# Patient Record
Sex: Female | Born: 1990 | Race: White | Hispanic: No | Smoking: Current every day smoker
Health system: Southern US, Community
[De-identification: ages and names within clinical notes are randomized; demographics above are authoritative.]

## PROBLEM LIST (undated history)

## (undated) DIAGNOSIS — C539 Malignant neoplasm of cervix uteri, unspecified: Secondary | ICD-10-CM

## (undated) DIAGNOSIS — R87619 Unspecified abnormal cytological findings in specimens from cervix uteri: Secondary | ICD-10-CM

## (undated) HISTORY — PX: GYNECOLOGIC CRYOSURGERY: SHX857

---
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
## 2010-07-02 LAB — CBC
HCT: 41.3 % (ref 36.0–46.0)
Hemoglobin: 13.6 g/dL (ref 12.0–15.0)
Platelets: 197 10*3/uL (ref 150–400)
WBC: 18.7 10*3/uL — ABNORMAL HIGH (ref 4.0–10.5)

## 2010-08-07 NOTE — H&P (Signed)
Sandra Andrews, Sandra Andrews                     ACCOUNT NO.:  [id number]   MEDICAL RECORD NO.:  [id number]                   PATIENT TYPE:  INP   LOCATION:  A426                                 FACILITY:  [facility]   PHYSICIAN:  Tilda Burrow, M.D.              DATE OF BIRTH:  May 27, 1990   DATE OF ADMISSION:  09/25/2003  DATE OF DISCHARGE:                                HISTORY & PHYSICAL   ADMISSION DIAGNOSES:  1. Pregnancy, 34 weeks 5 days.  2. Preeclampsia.  3. Adolescent pregnancy.  4. History of elevated maternal serum alpha-fetoprotein at 16 weeks'     gestation.   HISTORY OF PRESENT ILLNESS:  This 20 year old female, gravida 1, para 0, AB  0, LMP January 25, 2003, placing menstrual [facility] August 12 with first  trimester ultrasound suggesting EDC of August 22, and 20-week ultrasound  suggesting August 23, is admitted at 34 weeks 5 days by menstrual criteria,  33 weeks by ultrasound criteria, after a pregnancy followed through our  office and notable for an elevated serum HCG on initial serum alpha-  fetoprotein obtained in March of this year.  She had slow but steady fundal  height growth through the late second trimester and early third trimester.  She had blood pressure of 100/70 at 28 weeks 5 days with negative protein,  100/72 with 1+ protein on September 16, 2003, and on return today has had a six-  pound weight gain, 2+ proteinuria, 4+ reflexes without clonus, and has had  two days of mild headache without right upper quadrant pain.  The headache  resolved with Tylenol.  She is admitted for monitoring, assessment of  severity of preeclampsia, and consideration for delivery if considered  severe.   PAST MEDICAL HISTORY:  Benign.   PAST SURGICAL HISTORY:  Broken arm as a child.   ALLERGIES:  None.   MEDICATIONS:  Prenatal vitamins.   Cigarettes, alcohol, recreational drugs denied.   SOCIAL HISTORY:  Lives with parents.  Home schooled by the neighbor this  year with plans to  return to public school after this year.   FAMILY HISTORY:  Positive for hypertension and stroke.  No family planning  to date.   Prenatal labs to date equal a blood type B positive.  UDS negative.  Hemoglobin 14, hematocrit 43.  Hepatitis, HIV, and GC and Chlamydia all  negative.  Repeat GC also negative.  Serum alpha-fetoprotein elevated serum  HCG on the MSAFP but normal Down syndrome risk.   PLAN:  See preeclampsia orders.     ___________________________________________                                         Tilda Burrow, M.D.   JVF/MEDQ  D:  09/25/2003  T:  09/25/2003  Job:  [id number]

## 2010-08-07 NOTE — Op Note (Signed)
[name], Sandra Andrews           ACCOUNT NO.:  [id number]   MEDICAL RECORD NO.:  [id number]          PATIENT TYPE:  AMB   LOCATION:  DAY                           FACILITY:  [facility]   PHYSICIAN:  Lazaro Arms, M.D.   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   PROCEDURE:  Holmium laser ablation of the cervix.   SURGEON:  Lazaro Arms, M.D.   ANESTHESIA:  General endotracheal.   FINDINGS:  The patient had abnormal Pap smear which showed high grade  dysplasia.  Did a colposcopy directed biopsies in the office which revealed  high grade squamous dysplasia with no invasion.  As a result, she is  admitted for laser ablation of the cervix.   DESCRIPTION OF PROCEDURE:  The patient was taken to the operating room and  placed in supine position where she underwent general endotracheal  anesthesia, placed in the dorsal lithotomy position.  Speculum was then  placed.  Paracervical block was placed using 0.5% Marcaine with 1:200,000  epinephrine.  The acetic acid was then used and a repeat colposcopy was  performed with the microscope.  The lesion was seen on the cervix.  The  Holmium laser was used with energy of 1, rate of 20, taking down with a good  margin beyond the dysplasia and the entire transformation zone,  squamocolumnar junction was ablated, taken down to 5-7 mm laterally and 7-9  mm central in a conical fashion.  There was good hemostasis.  Monsel's was  placed on the cervix to prevent future bleeding.  The patient tolerated the  procedure well.  She experienced no blood loss and was taken to the recovery  room in good stable condition.  All counts were correct.      Lazaro Arms, M.D.  Electronically Signed     LHE/MEDQ  D:  09/08/2005   T:  09/08/2005  Job:  [id number]

## 2010-08-07 NOTE — Discharge Summary (Signed)
NAMEJEANETTA, Sandra Andrews                     ACCOUNT NO.:  [id number]   MEDICAL RECORD NO.:  [id number]                   PATIENT TYPE:  INP   LOCATION:  9124                                 FACILITY:  [facility]   PHYSICIAN:  Phil D. Okey Dupre, M.D.                  DATE OF BIRTH:  1990-04-13   DATE OF ADMISSION:  09/25/2003  DATE OF DISCHARGE:  10/02/2003                                 DISCHARGE SUMMARY   ADMISSION DIAGNOSES:  1. A 20 year old gravida 1 at 31 and five-sevenths weeks with edema.  2. Elevated blood pressures to 158/100.   DISCHARGE DIAGNOSES:  1. A 20 year old gravida 1 para 1 postpartum day #4 status post spontaneous     vaginal delivery.  2. Resolved hemolysis, elevated liver function, and low platelets syndrome.  3. Viable infant boy at [redacted] weeks gestation in the neonatal intensive care     unit.  Apgars 1 at one minute and 5 at five minutes; 10-minute Apgar is     unknown at the time of dictation.   DISCHARGE MEDICATIONS:  1. Prenatal vitamins one p.o. daily for 6 weeks.  2. Ibuprofen 600 mg p.o. q.6h. p.r.n.  3. Metoprolol 25 mg p.o. b.i.d.  4. Hydrochlorothiazide 25 mg p.o. daily.  5. Depo-Provera 150 mg IM every 3 months.   ADMISSION HISTORY:  Sandra Andrews presented at 51 and 5 weeks with suspected  preeclampsia.  She was transferred from [facility] in [place] with blood  pressures of 150/100 and facial and upper extremity edema.   HOSPITAL COURSE:  #1 - PREECLAMPSIA.  On admission the patient was started  on magnesium sulfate.  She was started on betamethasone IM x2 for fetal lung  maturity.  On exam she had 2+ proteinuria in her urine, 4+ patellar  reflexes, and her blood pressures were improved at 135/72 on admission.  Her  cervix at that time was considered unfavorable.  On hospital day #1 her  blood pressures remained in the systolics of 140 and diastolics of 90.  An  ultrasound was done which showed fetal growth between the fifth and tenth  percentile  consistent with IUGR but not severe.  Fetal heart tracing  remained reassuring.  A 24-hour urine was collected.  The patient was noted  to be hemoconcentrated the morning of hospital day #1 with a hemoglobin of  14.6 and potassium of 6.3.  Her IV fluid rate was changed accordingly.  The  patient was given her second dose of betamethasone that evening.  On  hospital day #2 her edema was improved.  She continued to have 4+ patellar  reflexes and the clonus which she had on admission of one beat in both legs  was resolved.  Twelve hours after her second dose of betamethasone her 24-  hour urine was returned which showed a protein level of 10.85 g.  At that  time it was thought that she was severely  preeclamptic and started on  induction with Cytotec.   Her labor course was uneventful.  She quickly dilated and was placed on  Pitocin for augmentation.  Her magnesium was continued throughout her labor  course.  She was GBS positive and placed on IV penicillin.  She had an  uneventful delivery with delivery of a viable female with Apgars of 1 and 5  which was transferred to the NICU.  The infant was intubated but quickly  extubated and on day of discharge the infant was doing well.   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  Her  platelets dropped to low of 58.  Her LDH peaked at 609.  Her liver enzymes  peaked at an AST of 290 and an ALT of 219.  She did not go into DIC and her  fibrinogen level remained quite elevated at 600 and then 769.  It then  started to trend downward after that.  When her platelets hit a nadir of 58  she was started on IV dexamethasone for a total of 2 days.  Her platelet  count then elevated to 137.  Her LDH dropped from 609 to 260.  Her liver  enzymes trended down towards normal with an AST of 20 and ALT of 68.  Her  magnesium was continued until the patient produced good urine output which  was approximately 3 days following her vaginal  delivery.  At that time her  magnesium was stopped.   #2 - ELEVATED BLOOD PRESSURES.  Her blood pressures did remain persistently  elevated in the 140s over 90s.  She did have a few elevated blood pressures  of 150s over 90s.  After delivery she was started on metoprolol 25 mg b.i.d.  as well as hydrochlorothiazide 25 mg daily.  Once the hydrochlorothiazide  takes effect her blood pressures should trend back towards a normal range.  She may have an element of chronic hypertension and need blood pressure  medication since she does have a strong family history of this.  However, it  is anticipated that she will come off her medications following her  postpartum check at 6 weeks.   The patient plans to bottle feed her infant.  She is going to use Depo-  Provera for contraception.  Her pain was well controlled throughout her  hospitalization on ibuprofen.   CONDITION ON DISCHARGE:  The patient was discharged to home in stable  condition.   INSTRUCTIONS GIVEN TO PATIENT:  The patient was told of the above medical  regimen.  She was also told to follow up with Dr. Emelda Fear for her  postpartum check.     Jon Gills, M.D.                     Phil D. Okey Dupre, M.D.    LC/MEDQ  D:  10/02/2003  T:  10/02/2003  Job:  [id number]   cc:   Tilda Burrow, M.D.  [address] [place]  [place] [address]  Fax: [phone]

## 2010-10-27 ENCOUNTER — Emergency Department (HOSPITAL_COMMUNITY)
Admission: EM | Admit: 2010-10-27 | Discharge: 2010-10-27 | Discharge status: Against Medical Advice | Payer: Self-pay | Attending: Emergency Medicine | Admitting: Emergency Medicine

## 2010-10-27 ENCOUNTER — Encounter: Payer: Self-pay | Admitting: Emergency Medicine

## 2010-10-27 DIAGNOSIS — J029 Acute pharyngitis, unspecified: Secondary | ICD-10-CM | POA: Insufficient documentation

## 2010-10-27 DIAGNOSIS — N390 Urinary tract infection, site not specified: Secondary | ICD-10-CM | POA: Insufficient documentation

## 2010-10-27 HISTORY — DX: Unspecified abnormal cytological findings in specimens from cervix uteri: R87.619

## 2010-10-27 LAB — RAPID STREP SCREEN (MED CTR MEBANE ONLY): Streptococcus, Group A Screen (Direct): NEGATIVE

## 2010-10-27 LAB — URINALYSIS, ROUTINE W REFLEX MICROSCOPIC
Specific Gravity, Urine: >1.03 — ABNORMAL HIGH (ref 1.005–1.030)
Urobilinogen, UA: 0.2 mg/dL (ref 0.0–1.0)

## 2010-10-27 LAB — URINE MICROSCOPIC-ADD ON

## 2010-10-27 NOTE — ED Notes (Signed)
Pt c/o sore throat, onset Sat night. Painful urination began this am. No fever or chills, no vomiting or diarrhea. No blood in urine.

## 2010-10-27 NOTE — ED Notes (Signed)
C/o sore throat onset 3 days ago; c/o onset of burning with urination this morning; denies abd pain, n/v

## 2010-10-27 NOTE — ED Notes (Signed)
Pt presents to nurses station stating she has to leave d/t her husband has been released from work early and she has to go pick him up; pt is unable to wait to sign AMA form; states she will try to return tomorrow to be seen.

## 2010-11-10 NOTE — ED Notes (Signed)
Patient eloped before evaluation.  Glynn Octave, MD 11/10/10 [phone]

## 2010-11-18 ENCOUNTER — Emergency Department (HOSPITAL_COMMUNITY)
Admission: EM | Admit: 2010-11-18 | Discharge: 2010-11-19 | Disposition: A | Discharge status: Home / Self Care | Payer: Self-pay | Attending: Emergency Medicine | Admitting: Emergency Medicine

## 2010-11-18 ENCOUNTER — Encounter (HOSPITAL_COMMUNITY): Payer: Self-pay | Admitting: *Deleted

## 2010-11-18 DIAGNOSIS — F411 Generalized anxiety disorder: Secondary | ICD-10-CM | POA: Insufficient documentation

## 2010-11-18 DIAGNOSIS — X789XXA Intentional self-harm by unspecified sharp object, initial encounter: Secondary | ICD-10-CM | POA: Insufficient documentation

## 2010-11-18 DIAGNOSIS — F329 Major depressive disorder, single episode, unspecified: Secondary | ICD-10-CM

## 2010-11-18 DIAGNOSIS — S51809A Unspecified open wound of unspecified forearm, initial encounter: Secondary | ICD-10-CM | POA: Insufficient documentation

## 2010-11-18 DIAGNOSIS — F3289 Other specified depressive episodes: Secondary | ICD-10-CM | POA: Insufficient documentation

## 2010-11-18 NOTE — ED Notes (Signed)
Pt brought in by [facility] for c/o cutting herself; pt states she does not want to commit suicide the cutting makes her feel better; pt has multiple superficial cuts to left forearm

## 2010-11-19 LAB — URINALYSIS, ROUTINE W REFLEX MICROSCOPIC
Glucose, UA: NEGATIVE mg/dL
Specific Gravity, Urine: 1.025 (ref 1.005–1.030)
Urobilinogen, UA: 0.2 mg/dL (ref 0.0–1.0)

## 2010-11-19 LAB — COMPREHENSIVE METABOLIC PANEL
BUN: 7 mg/dL (ref 6–23)
Calcium: 9.2 mg/dL (ref 8.4–10.5)
Creatinine, Ser: 0.65 mg/dL (ref 0.50–1.10)
GFR calc Af Amer: >60 mL/min (ref >60)
Glucose, Bld: 88 mg/dL (ref 70–99)
Total Protein: 7.3 g/dL (ref 6.0–8.3)

## 2010-11-19 LAB — CBC
HCT: 45 % (ref 36.0–46.0)
Hemoglobin: 15.6 g/dL — ABNORMAL HIGH (ref 12.0–15.0)
MCH: 32.8 pg (ref 26.0–34.0)
MCHC: 34.7 g/dL (ref 30.0–36.0)
MCV: 94.5 fL (ref 78.0–100.0)

## 2010-11-19 LAB — URINE MICROSCOPIC-ADD ON

## 2010-11-19 LAB — RAPID URINE DRUG SCREEN, HOSP PERFORMED
Cocaine: NOT DETECTED
Opiates: NOT DETECTED

## 2010-11-19 LAB — ETHANOL: Alcohol, Ethyl (B): <11 mg/dL (ref 0–11)

## 2010-11-19 NOTE — ED Notes (Signed)
Ella from ACT team has been in to speak with patient.  Patient remains calm and cooperative at this time.

## 2011-01-16 NOTE — ED Provider Notes (Addendum)
History     CSN: [id number] Arrival date & time: 11/18/2010 11:08 PM   None     Chief Complaint  Patient presents with  . Medical Clearance    (Consider location/radiation/quality/duration/timing/severity/associated sxs/prior treatment) HPI Comments: Seen 0031. Patient brought in by [facility] after being called by a family member. Stated patient had cut herself in an attempt to kill herself. Patient states there was no attempt to kill herself. She uses cutting for relief of anxiety and stress. She states that family member that called police just wanted her out of the house. She denies suicidal ideation, homicidal ideation. She is not experiencing hallucinations, is not psychotic.  Patient is a 20 y.o. female presenting with mental health disorder. The history is provided by the patient.  Mental Health Problem The primary symptoms include dysphoric mood. The current episode started today (Had anargument with family member that made her feel anxious and worthless. She cut herself to ease the anxiety and pain.).  The onset of the illness is precipitated by a stressful event and emotional stress. The degree of incapacity that she is experiencing as a consequence of her illness is moderate. Additional symptoms of the illness include feelings of worthlessness and poor judgment. She does not admit to suicidal ideas. She does not have a plan to commit suicide. She contemplates harming herself. She has already injured self. She does not contemplate injuring another person. She has not already  injured another person. Risk factors: history of cutting.    Past Medical History  Diagnosis Date  . Pap smear abnormality of cervix     Past Surgical History  Procedure Date  . Gynecologic cryosurgery     History reviewed. No pertinent family history.  History  Substance Use Topics  . Smoking status: Current Everyday Smoker -- 1.0 packs/day for 8 years    Types: Cigarettes  . Smokeless tobacco: Never  Used  . Alcohol Use: 1.2 oz/week    2 Cans of beer per week    OB History    Grav Para Term Preterm Abortions TAB SAB Ect Mult Living   2 2  2             Review of Systems  Psychiatric/Behavioral: Positive for dysphoric mood.       Anxious  All other systems reviewed and are negative.    Allergies  Review of patient's allergies indicates no known allergies.  Home Medications   Current Outpatient Rx  Name Route Sig Dispense Refill  . LEVONORGEST-ETH ESTRAD 91-DAY 0.15-0.03 MG PO TABS Oral Take 1 tablet by mouth daily.      . MULTI-VITAMIN PO Oral Take by mouth.        BP 117/79  Pulse 101  Temp(Src) 98.5 F (36.9 C) (Oral)  Resp 24  Ht 5\' 2"  (1.575 m)  Wt 153 lb (69.4 kg)  BMI 27.98 kg/m2  SpO2 99%  LMP 09/25/2010  Physical Exam  Nursing note and vitals reviewed. Constitutional: She is oriented to person, place, and time. She appears well-developed and well-nourished.  HENT:  Head: Normocephalic and atraumatic.  Right Ear: External ear normal.  Left Ear: External ear normal.  Mouth/Throat: Oropharynx is clear and moist.  Eyes: EOM are normal.  Neck: Normal range of motion. Neck supple. No thyromegaly present.  Cardiovascular: Normal rate, normal heart sounds and intact distal pulses.   Pulmonary/Chest: Effort normal and breath sounds normal.  Abdominal: Soft. Bowel sounds are normal.  Musculoskeletal: Normal range of motion.  Left wrist and forearm with several superficial cuts that are parallel. No active bleeding. Able to move both hands, wiggle all fingers, cap refill brisk.  Neurological: She is alert and oriented to person, place, and time. She has normal reflexes.  Skin: Skin is warm and dry.  Psychiatric:       Patient is anxious. States she has had anxiety for a long time. She states she uses cutting to help relieve the anxiety. She is not currently under the care of a psychiatrist.    ED Course  Procedures (including critical care  time)  Labs Reviewed  CBC - Abnormal; Notable for the following:    WBC 18.9 (*)    Hemoglobin 15.6 (*)    All other components within normal limits  COMPREHENSIVE METABOLIC PANEL - Abnormal; Notable for the following:    Total Bilirubin 0.2 (*)    All other components within normal limits  URINE RAPID DRUG SCREEN (HOSP PERFORMED) - Abnormal; Notable for the following:    Benzodiazepines POSITIVE (*)    All other components within normal limits  URINALYSIS, ROUTINE W REFLEX MICROSCOPIC - Abnormal; Notable for the following:    Color, Urine AMBER (*) BIOCHEMICALS MAY BE AFFECTED BY COLOR   Appearance CLOUDY (*)    Hgb urine dipstick TRACE (*)    Bilirubin Urine SMALL (*)    Nitrite POSITIVE (*)    Leukocytes, UA SMALL (*)    All other components within normal limits  URINE MICROSCOPIC-ADD ON - Abnormal; Notable for the following:    Squamous Epithelial / LPF MANY (*)    Bacteria, UA MANY (*)    All other components within normal limits  ETHANOL  ACETAMINOPHEN LEVEL  PREGNANCY, URINE  LAB REPORT - SCANNED   No results found.   1. Depression   2. Anxiety state       MDM  Patient brought in by police at the insistence of a family member who states patient is trying to harm herself. Patient admits to cutting as a release for anxiety but denies suicidal thoughts.While she cut her wrist, cuts are superficial and she admits she has done similar cutting in the past to alleviate anxiety. Wrist lacerations were cleaned and dressed by nursing.  She was given information re [facility]. She will  Be discharged home in the company of a family member.Pt stable in ED with no significant deterioration in condition.Patient and family member  informed of clinical course, understand medical decision-making process, and agree with plan. MDM Reviewed: nursing note, vitals and previous chart           Nicoletta Dress. Colon Branch, MD 01/16/11 1425  Nicoletta Dress. Colon Branch, MD 01/16/11 1425

## 2011-05-17 ENCOUNTER — Emergency Department (HOSPITAL_COMMUNITY): Payer: Self-pay

## 2011-05-17 ENCOUNTER — Emergency Department (HOSPITAL_COMMUNITY)
Admission: EM | Admit: 2011-05-17 | Discharge: 2011-05-17 | Disposition: A | Discharge status: Home / Self Care | Payer: Self-pay | Attending: Emergency Medicine | Admitting: Emergency Medicine

## 2011-05-17 ENCOUNTER — Encounter (HOSPITAL_COMMUNITY): Payer: Self-pay | Admitting: *Deleted

## 2011-05-17 DIAGNOSIS — M25469 Effusion, unspecified knee: Secondary | ICD-10-CM | POA: Insufficient documentation

## 2011-05-17 DIAGNOSIS — R269 Unspecified abnormalities of gait and mobility: Secondary | ICD-10-CM | POA: Insufficient documentation

## 2011-05-17 DIAGNOSIS — Z8541 Personal history of malignant neoplasm of cervix uteri: Secondary | ICD-10-CM | POA: Insufficient documentation

## 2011-05-17 DIAGNOSIS — M25569 Pain in unspecified knee: Secondary | ICD-10-CM | POA: Insufficient documentation

## 2011-05-17 DIAGNOSIS — F17200 Nicotine dependence, unspecified, uncomplicated: Secondary | ICD-10-CM | POA: Insufficient documentation

## 2011-05-17 DIAGNOSIS — M25561 Pain in right knee: Secondary | ICD-10-CM

## 2011-05-17 DIAGNOSIS — W108XXA Fall (on) (from) other stairs and steps, initial encounter: Secondary | ICD-10-CM | POA: Insufficient documentation

## 2011-05-17 HISTORY — DX: Malignant neoplasm of cervix uteri, unspecified: C53.9

## 2011-05-17 MED ORDER — HYDROCODONE-ACETAMINOPHEN 5-325 MG PO TABS
1.0000 | ORAL_TABLET | Freq: Once | ORAL | Status: AC
  Administered 2011-05-17: 1 via ORAL
  Filled 2011-05-17: qty 1

## 2011-05-17 MED ORDER — IBUPROFEN 800 MG PO TABS: 800.0000 mg | ORAL_TABLET | Freq: Three times a day (TID) | ORAL | Status: AC

## 2011-05-17 MED ORDER — HYDROCODONE-ACETAMINOPHEN 5-325 MG PO TABS: ORAL_TABLET | ORAL | Status: AC

## 2011-05-17 MED ORDER — IBUPROFEN 800 MG PO TABS
800.0000 mg | ORAL_TABLET | Freq: Once | ORAL | Status: AC
  Administered 2011-05-17: 800 mg via ORAL
  Filled 2011-05-17: qty 1

## 2011-05-17 NOTE — ED Notes (Signed)
Pt fell after going up some stairs yesterday and states right knee popped and has pain

## 2011-05-17 NOTE — Discharge Instructions (Signed)
Knee Pain °Knee pain can be a result of an injury or other medical conditions. Treatment will depend on the cause of your pain. °HOME CARE °· Only take medicine as told by your doctor.  °· Keep a healthy weight. Being overweight can make the knee hurt more.  °· Stretch before exercising or playing sports.  °· If there is constant knee pain, change the way you exercise. Ask your doctor for advice.  °· Make sure shoes fit well. Choose the right shoe for the sport or activity.  °· Protect your knees. Wear kneepads if needed.  °· Rest when you are tired.  °GET HELP RIGHT AWAY IF:  °· Your knee pain does not stop.  °· Your knee pain does not get better.  °· Your knee joint feels hot to the touch.  °· You have a temperature by mouth above 102° F (38.9° C), not controlled by medicine.  °· Your baby is older than 3 months with a rectal temperature of 102° F (38.9° C) or higher.  °· Your baby is 3 months old or younger with a rectal temperature of 100.4° F (38° C) or higher.  °MAKE SURE YOU:  °· Understand these instructions.  °· Will watch this condition.  °· Will get help right away if you are not doing well or get worse.  °Document Released: 06/04/2008 Document Revised: 11/18/2010 Document Reviewed: 06/04/2008 °[employer]® Patient Information ©2012 [employer]. °

## 2011-05-17 NOTE — ED Notes (Signed)
Rt knee pain, twisted and felt pop when stepped down.  Injured last pm.

## 2011-05-17 NOTE — ED Provider Notes (Signed)
History     CSN: [id number]  Arrival date & time 05/17/11  1331   First MD Initiated Contact with Patient 05/17/11 1658      Chief Complaint  Patient presents with  . Fall  . Knee Pain    (Consider location/radiation/quality/duration/timing/severity/associated sxs/prior treatment) HPI Comments: Patient c/o pain to right knee that began after she slipped while going up some steps.  States she heard a "pop" in her knee and now has pain with weight bearing and feels like her knee is unsteady.  She denies other injuries, LOC or swelling  Patient is a 21 y.o. female presenting with fall and knee pain. The history is provided by the patient. No language interpreter was used.  Fall The accident occurred yesterday. The fall occurred while walking. She fell from a height of 1 to 2 ft. She landed on a hard floor. There was no blood loss. The point of impact was the right knee. The pain is present in the right knee. The pain is at a severity of 8/10. The pain is mild. She was ambulatory at the scene. There was no entrapment after the fall. There was no drug use involved in the accident. There was no alcohol use involved in the accident. Pertinent negatives include no fever, no numbness, no abdominal pain, no bowel incontinence, no nausea, no vomiting, no hematuria, no headaches, no loss of consciousness and no tingling. The symptoms are aggravated by standing, ambulation, rotation, extension and flexion. She has tried nothing for the symptoms. The treatment provided no relief.  Knee Pain Associated symptoms include arthralgias. Pertinent negatives include no abdominal pain, chest pain, chills, fatigue, fever, headaches, joint swelling, myalgias, nausea, neck pain, numbness, rash, sore throat, vomiting or weakness.    Past Medical History  Diagnosis Date  . Pap smear abnormality of cervix   . Cancer of cervix     Past Surgical History  Procedure Date  . Gynecologic cryosurgery     History  reviewed. No pertinent family history.  History  Substance Use Topics  . Smoking status: Current Everyday Smoker -- 1.0 packs/day for 8 years    Types: Cigarettes  . Smokeless tobacco: Never Used  . Alcohol Use: 1.2 oz/week    2 Cans of beer per week    OB History    Grav Para Term Preterm Abortions TAB SAB Ect Mult Living   2 2  2             Review of Systems  Constitutional: Negative for fever, chills and fatigue.  HENT: Negative for sore throat, trouble swallowing, neck pain and neck stiffness.   Respiratory: Negative for shortness of breath.   Cardiovascular: Negative for chest pain.  Gastrointestinal: Negative for nausea, vomiting, abdominal pain and bowel incontinence.  Genitourinary: Negative for hematuria.  Musculoskeletal: Positive for arthralgias and gait problem. Negative for myalgias, back pain and joint swelling.  Skin: Negative.  Negative for rash.  Neurological: Negative for dizziness, tingling, loss of consciousness, weakness, numbness and headaches.  Hematological: Does not bruise/bleed easily.  All other systems reviewed and are negative.    Allergies  Review of patient's allergies indicates no known allergies.  Home Medications   Current Outpatient Rx  Name Route Sig Dispense Refill  . LEVONORGEST-ETH ESTRAD 91-DAY 0.15-0.03 MG PO TABS Oral Take 1 tablet by mouth daily.      . MULTI-VITAMIN PO Oral Take by mouth.        BP 137/82  Pulse 85  Temp(Src) 98.1  F (36.7 C) (Oral)  Resp 20  Ht 5\' 3"  (1.6 m)  Wt 154 lb (69.854 kg)  BMI 27.28 kg/m2  SpO2 100%  LMP 05/01/2011  Physical Exam  Nursing note and vitals reviewed. Constitutional: She is oriented to person, place, and time. She appears well-developed and well-nourished. No distress.  HENT:  Head: Normocephalic.  Neck: Normal range of motion.  Cardiovascular: Normal rate, regular rhythm and intact distal pulses.   Pulmonary/Chest: Effort normal and breath sounds normal.  Musculoskeletal:  She exhibits tenderness. She exhibits no edema.       Right knee: She exhibits normal range of motion, no swelling, no effusion, no ecchymosis, no deformity, no laceration, no erythema, normal patellar mobility, no bony tenderness and normal meniscus. tenderness found. Lateral joint line tenderness noted. No patellar tendon tenderness noted.       Legs: Lymphadenopathy:    She has no cervical adenopathy.  Neurological: She is oriented to person, place, and time.  Skin: Skin is warm and dry.    ED Course  Procedures (including critical care time)  Dg Knee Complete 4 Views Right  05/17/2011  *RADIOLOGY REPORT*  Clinical Data: Pain post fall  RIGHT KNEE - COMPLETE 4+ VIEW  Comparison: None.  Findings: Small effusion in the suprapatellar bursa. Negative for fracture, dislocation, or other acute abnormality.  Normal alignment and mineralization. No significant degenerative change. Regional soft tissues unremarkable.  IMPRESSION:     1.     Negative for fracture or other acute bony abnormality.       2.  Knee effusion.        Original Report Authenticated By: Osa Craver, M.D.    Knee immobilizer applied to the right knee by the nursing staff.  Pain improved.  Remains NV intact.    MDM     ttp of the lateral right knee with effusion present on imaging.  Will apply knee immob. And pt agrees to close f/u with Dr. Romeo Apple.    Pt feels improved after observation and/or treatment in ED. Patient / Family / Caregiver understand and agree with initial ED impression and plan with expectations set for ED visit. Pt stable in ED with no significant deterioration in condition.    Kylo Gavin L. [place], [place] 05/17/11 1810

## 2011-05-18 NOTE — ED Provider Notes (Signed)
Evaluation and management procedures were performed by the PA/NP under my supervision/collaboration.   Felisa Bonier, MD 05/18/11 2032

## 2011-05-27 ENCOUNTER — Ambulatory Visit (INDEPENDENT_AMBULATORY_CARE_PROVIDER_SITE_OTHER): Payer: Self-pay | Admitting: Orthopedic Surgery

## 2011-05-27 ENCOUNTER — Encounter: Payer: Self-pay | Admitting: Orthopedic Surgery

## 2011-05-27 VITALS — BP 120/70 | Ht 63.0 in | Wt 154.0 lb

## 2011-05-27 DIAGNOSIS — M25569 Pain in unspecified knee: Secondary | ICD-10-CM | POA: Insufficient documentation

## 2011-05-27 DIAGNOSIS — S83006A Unspecified dislocation of unspecified patella, initial encounter: Secondary | ICD-10-CM | POA: Insufficient documentation

## 2011-05-27 MED ORDER — HYDROCODONE-ACETAMINOPHEN 5-325 MG PO TABS: 1.0000 | ORAL_TABLET | Freq: Four times a day (QID) | ORAL | Status: AC | PRN

## 2011-05-27 NOTE — Progress Notes (Signed)
  Subjective:    Sandra Andrews is a 21 y.o. female who presents with RIGHT knee pain since February 24.  The patient fell down some stairs complains of 7/10 constant lateral knee pain associated with some popping.  She's been on ibuprofen and hydrocodone.  She has some swelling in the knee she reports previous knee injuries on several occasions but no specific treatment  She has a history of heartburn and constipation some changes in the skin with itching some anxiety and depression along with nervousness excessive urination thirst and bruising and seasonal ALLERGIES or other systems are normal  Past Medical History  Diagnosis Date  . Pap smear abnormality of cervix   . Cancer of cervix    Past Surgical History  Procedure Date  . Gynecologic cryosurgery     Vital signs are stable as recorded  General appearance is normal  The patient is alert and oriented x3  The patient's mood and affect are normal  Gait assessment: Abnormal gait with crutches and a knee brace associated with a limp The cardiovascular exam reveals normal pulses and temperature without edema swelling.  The lymphatic system is negative for palpable lymph nodes  The sensory exam is normal.  There are no pathologic reflexes.  Balance is normal.   Exam of the RIGHT knee Inspection mild swelling and small effusion Range of motion full Stability normal Strength normal Skin normal  The patient has a normal knee exam with an x-ray showing a small joint effusion  The popping she describes most like it was a patellofemoral subluxation  She is uninsured  Recommend home exercise program, she will have to buy a stabilizer brace for her patella.  She can take some medication for pain for 2 weeks.  No surgery is needed and follow up as needed

## 2011-05-27 NOTE — Patient Instructions (Addendum)
Do knee exercises and wear brace for 6 weeks

## 2011-10-09 ENCOUNTER — Encounter (HOSPITAL_COMMUNITY): Payer: Self-pay | Admitting: *Deleted

## 2011-10-09 DIAGNOSIS — W19XXXA Unspecified fall, initial encounter: Secondary | ICD-10-CM | POA: Insufficient documentation

## 2011-10-09 DIAGNOSIS — M25569 Pain in unspecified knee: Secondary | ICD-10-CM | POA: Insufficient documentation

## 2011-10-09 DIAGNOSIS — Y9301 Activity, walking, marching and hiking: Secondary | ICD-10-CM | POA: Insufficient documentation

## 2011-10-09 DIAGNOSIS — F17200 Nicotine dependence, unspecified, uncomplicated: Secondary | ICD-10-CM | POA: Insufficient documentation

## 2011-10-09 DIAGNOSIS — M25579 Pain in unspecified ankle and joints of unspecified foot: Secondary | ICD-10-CM | POA: Insufficient documentation

## 2011-10-09 DIAGNOSIS — S93409A Sprain of unspecified ligament of unspecified ankle, initial encounter: Secondary | ICD-10-CM | POA: Insufficient documentation

## 2011-10-09 NOTE — ED Notes (Signed)
Pt reports stepping down onto porch, and leg twisted.  Reports feeling a pop. Unknown if injury to ankle or knee, pt reporting pain in each.

## 2011-10-10 ENCOUNTER — Emergency Department (HOSPITAL_COMMUNITY): Payer: Self-pay

## 2011-10-10 ENCOUNTER — Emergency Department (HOSPITAL_COMMUNITY)
Admission: EM | Admit: 2011-10-10 | Discharge: 2011-10-10 | Disposition: A | Discharge status: Home / Self Care | Payer: Self-pay | Attending: Emergency Medicine | Admitting: Emergency Medicine

## 2011-10-10 DIAGNOSIS — S93401A Sprain of unspecified ligament of right ankle, initial encounter: Secondary | ICD-10-CM

## 2011-10-10 MED ORDER — OXYCODONE-ACETAMINOPHEN 5-325 MG PO TABS: 1.0000 | ORAL_TABLET | ORAL | Status: AC | PRN

## 2011-10-10 MED ORDER — OXYCODONE-ACETAMINOPHEN 5-325 MG PO TABS
1.0000 | ORAL_TABLET | Freq: Once | ORAL | Status: AC
  Administered 2011-10-10: 1 via ORAL
  Filled 2011-10-10: qty 1

## 2011-10-10 NOTE — ED Notes (Signed)
Patient sitting quietly, no acute distress at this time.
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
## 2011-10-21 ENCOUNTER — Emergency Department (HOSPITAL_COMMUNITY): Payer: Self-pay

## 2011-10-21 ENCOUNTER — Emergency Department (HOSPITAL_COMMUNITY)
Admission: EM | Admit: 2011-10-21 | Discharge: 2011-10-21 | Disposition: A | Discharge status: Home / Self Care | Payer: Self-pay | Attending: Emergency Medicine | Admitting: Emergency Medicine

## 2011-10-21 ENCOUNTER — Encounter (HOSPITAL_COMMUNITY): Payer: Self-pay | Admitting: *Deleted

## 2011-10-21 DIAGNOSIS — F17200 Nicotine dependence, unspecified, uncomplicated: Secondary | ICD-10-CM | POA: Insufficient documentation

## 2011-10-21 DIAGNOSIS — S60229A Contusion of unspecified hand, initial encounter: Secondary | ICD-10-CM | POA: Insufficient documentation

## 2011-10-21 MED ORDER — HYDROCODONE-ACETAMINOPHEN 5-325 MG PO TABS: 2.0000 | ORAL_TABLET | ORAL | Status: AC | PRN
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
## 2012-01-04 ENCOUNTER — Encounter (HOSPITAL_COMMUNITY): Payer: Self-pay

## 2012-01-04 ENCOUNTER — Emergency Department (HOSPITAL_COMMUNITY)
Admission: EM | Admit: 2012-01-04 | Discharge: 2012-01-05 | Disposition: A | Discharge status: Home / Self Care | Payer: Self-pay | Attending: Emergency Medicine | Admitting: Emergency Medicine

## 2012-01-04 DIAGNOSIS — S80869A Insect bite (nonvenomous), unspecified lower leg, initial encounter: Secondary | ICD-10-CM | POA: Insufficient documentation

## 2012-01-04 DIAGNOSIS — R000 Tachycardia, unspecified: Secondary | ICD-10-CM | POA: Insufficient documentation

## 2012-01-04 DIAGNOSIS — L089 Local infection of the skin and subcutaneous tissue, unspecified: Secondary | ICD-10-CM | POA: Insufficient documentation

## 2012-01-04 DIAGNOSIS — F17200 Nicotine dependence, unspecified, uncomplicated: Secondary | ICD-10-CM | POA: Insufficient documentation

## 2012-01-04 DIAGNOSIS — Z8541 Personal history of malignant neoplasm of cervix uteri: Secondary | ICD-10-CM | POA: Insufficient documentation

## 2012-01-04 LAB — POCT PREGNANCY, URINE: Preg Test, Ur: NEGATIVE

## 2012-01-04 MED ORDER — SULFAMETHOXAZOLE-TMP DS 800-160 MG PO TABS
1.0000 | ORAL_TABLET | Freq: Once | ORAL | Status: AC
  Administered 2012-01-05: 1 via ORAL
  Filled 2012-01-04: qty 1

## 2012-01-04 MED ORDER — AMOXICILLIN 250 MG PO CAPS
1000.0000 mg | ORAL_CAPSULE | Freq: Once | ORAL | Status: AC
  Administered 2012-01-04: 1000 mg via ORAL
  Filled 2012-01-04: qty 4

## 2012-01-04 MED ORDER — SULFAMETHOXAZOLE-TRIMETHOPRIM 800-160 MG PO TABS: 1.0000 | ORAL_TABLET | Freq: Two times a day (BID) | ORAL | Status: DC

## 2012-01-04 MED ORDER — ONDANSETRON HCL 4 MG PO TABS
4.0000 mg | ORAL_TABLET | Freq: Once | ORAL | Status: AC
  Administered 2012-01-05: 4 mg via ORAL
  Filled 2012-01-04: qty 1

## 2012-01-04 MED ORDER — HYDROCODONE-ACETAMINOPHEN 5-325 MG PO TABS: ORAL_TABLET | ORAL | Status: DC

## 2012-01-04 MED ORDER — HYDROCODONE-ACETAMINOPHEN 5-325 MG PO TABS
2.0000 | ORAL_TABLET | Freq: Once | ORAL | Status: AC
  Administered 2012-01-05: 2 via ORAL
  Filled 2012-01-04: qty 2

## 2012-01-04 MED ORDER — MELOXICAM 7.5 MG PO TABS: ORAL_TABLET | ORAL | Status: DC

## 2012-01-04 MED ORDER — AMOXICILLIN 500 MG PO CAPS: ORAL_CAPSULE | ORAL | Status: DC

## 2012-01-04 MED ORDER — KETOROLAC TROMETHAMINE 10 MG PO TABS
10.0000 mg | ORAL_TABLET | Freq: Once | ORAL | Status: AC
  Administered 2012-01-05: 10 mg via ORAL
  Filled 2012-01-04: qty 1

## 2012-01-04 NOTE — ED Provider Notes (Signed)
History     CSN: [id number]  Arrival date & time 01/04/12  2312   First MD Initiated Contact with Patient 01/04/12 2323      Chief Complaint  Patient presents with  . Insect Bite    (Consider location/radiation/quality/duration/timing/severity/associated sxs/prior treatment) Patient is a 21 y.o. female presenting with abscess. The history is provided by the patient.  Abscess  This is a new problem. The current episode started less than one week ago. The problem occurs continuously. The problem has been gradually worsening. The abscess is present on the left ankle. The problem is severe. The abscess is characterized by redness and painfulness. The patient was exposed to an insect bite/sting. The abscess first occurred at home. Pertinent negatives include no fever and no cough. Her past medical history does not include skin abscesses in family. There were no sick contacts. She has received no recent medical care.    Past Medical History  Diagnosis Date  . Pap smear abnormality of cervix   . Cancer of cervix     Past Surgical History  Procedure Date  . Gynecologic cryosurgery     Family History  Problem Relation Age of Onset  . Heart disease    . Arthritis    . Lung disease    . Cancer    . Asthma    . Diabetes      History  Substance Use Topics  . Smoking status: Current Every Day Smoker -- 1.0 packs/day for 8 years    Types: Cigarettes  . Smokeless tobacco: Never Used  . Alcohol Use: 0.0 oz/week     occasional    OB History    Grav Para Term Preterm Abortions TAB SAB Ect Mult Living   2 2  2             Review of Systems  Constitutional: Negative for fever and activity change.       All ROS Neg except as noted in HPI  HENT: Negative for nosebleeds and neck pain.   Eyes: Negative for photophobia and discharge.  Respiratory: Negative for cough, shortness of breath and wheezing.   Cardiovascular: Negative for chest pain and palpitations.  Gastrointestinal:  Negative for abdominal pain and blood in stool.  Genitourinary: Negative for dysuria, frequency and hematuria.  Musculoskeletal: Negative for back pain and arthralgias.  Skin: Negative.   Neurological: Negative for dizziness, seizures and speech difficulty.  Psychiatric/Behavioral: Negative for hallucinations and confusion.    Allergies  Review of patient's allergies indicates no known allergies.  Home Medications  No current outpatient prescriptions on file.  BP 135/85  Pulse 108  Temp 97.8 F (36.6 C) (Oral)  Resp 16  Ht 5\' 3"  (1.6 m)  Wt 163 lb (73.936 kg)  BMI 28.87 kg/m2  SpO2 100%  LMP 12/05/2011  Physical Exam  Nursing note and vitals reviewed. Constitutional: She is oriented to person, place, and time. She appears well-developed and well-nourished.  Non-toxic appearance.  HENT:  Head: Normocephalic.  Right Ear: Tympanic membrane and external ear normal.  Left Ear: Tympanic membrane and external ear normal.  Eyes: EOM and lids are normal. Pupils are equal, round, and reactive to light.  Neck: Normal range of motion. Neck supple. Carotid bruit is not present.  Cardiovascular: Regular rhythm, normal heart sounds, intact distal pulses and normal pulses.  Tachycardia present.   Pulmonary/Chest: Breath sounds normal. No respiratory distress.  Abdominal: Soft. Bowel sounds are normal. There is no tenderness. There is no guarding.  Musculoskeletal: Normal range of motion.       nickle sized red area with a blister in the center at the left ankle (medial). Tender to palpation. No red streaking. Warm but not hot. Pain with ROM of tha left ankle. Distal pulses 2+.  Lymphadenopathy:       Head (right side): No submandibular adenopathy present.       Head (left side): No submandibular adenopathy present.    She has no cervical adenopathy.  Neurological: She is alert and oriented to person, place, and time. She has normal strength. No cranial nerve deficit or sensory deficit.    Skin: Skin is warm and dry.  Psychiatric: She has a normal mood and affect. Her speech is normal.    ED Course  Procedures (including critical care time)  Labs Reviewed - No data to display No results found.   No diagnosis found.    MDM  I have reviewed nursing notes, vital signs, and all appropriate lab and imaging results for this patient. Pt has an infected insect bite of the left ankle. Will treat with amoxil and septra. Rx for mobic and norco also given. Pt to use warm tub soak, and bandage daily.       Kathie Dike, [place] 01/04/12 [phone]

## 2012-01-04 NOTE — ED Notes (Signed)
Pt has raised red area on inside of left ankle x 2 days, painful

## 2012-01-05 NOTE — ED Provider Notes (Signed)
Medical screening examination/treatment/procedure(s) were performed by non-physician practitioner and as supervising physician I was immediately available for consultation/collaboration.  Sunnie Nielsen, MD 01/05/12 [phone]

## 2013-02-04 IMAGING — CR DG ANKLE COMPLETE 3+V*R*
3 series · 3 of 3 positions shown · non-contrast
Comparison: None

CLINICAL DATA: Fell last night.  Twisting of the ankle.  Pain.

RIGHT ANKLE - COMPLETE 3+ VIEW

[view not recorded (1 of 3)]
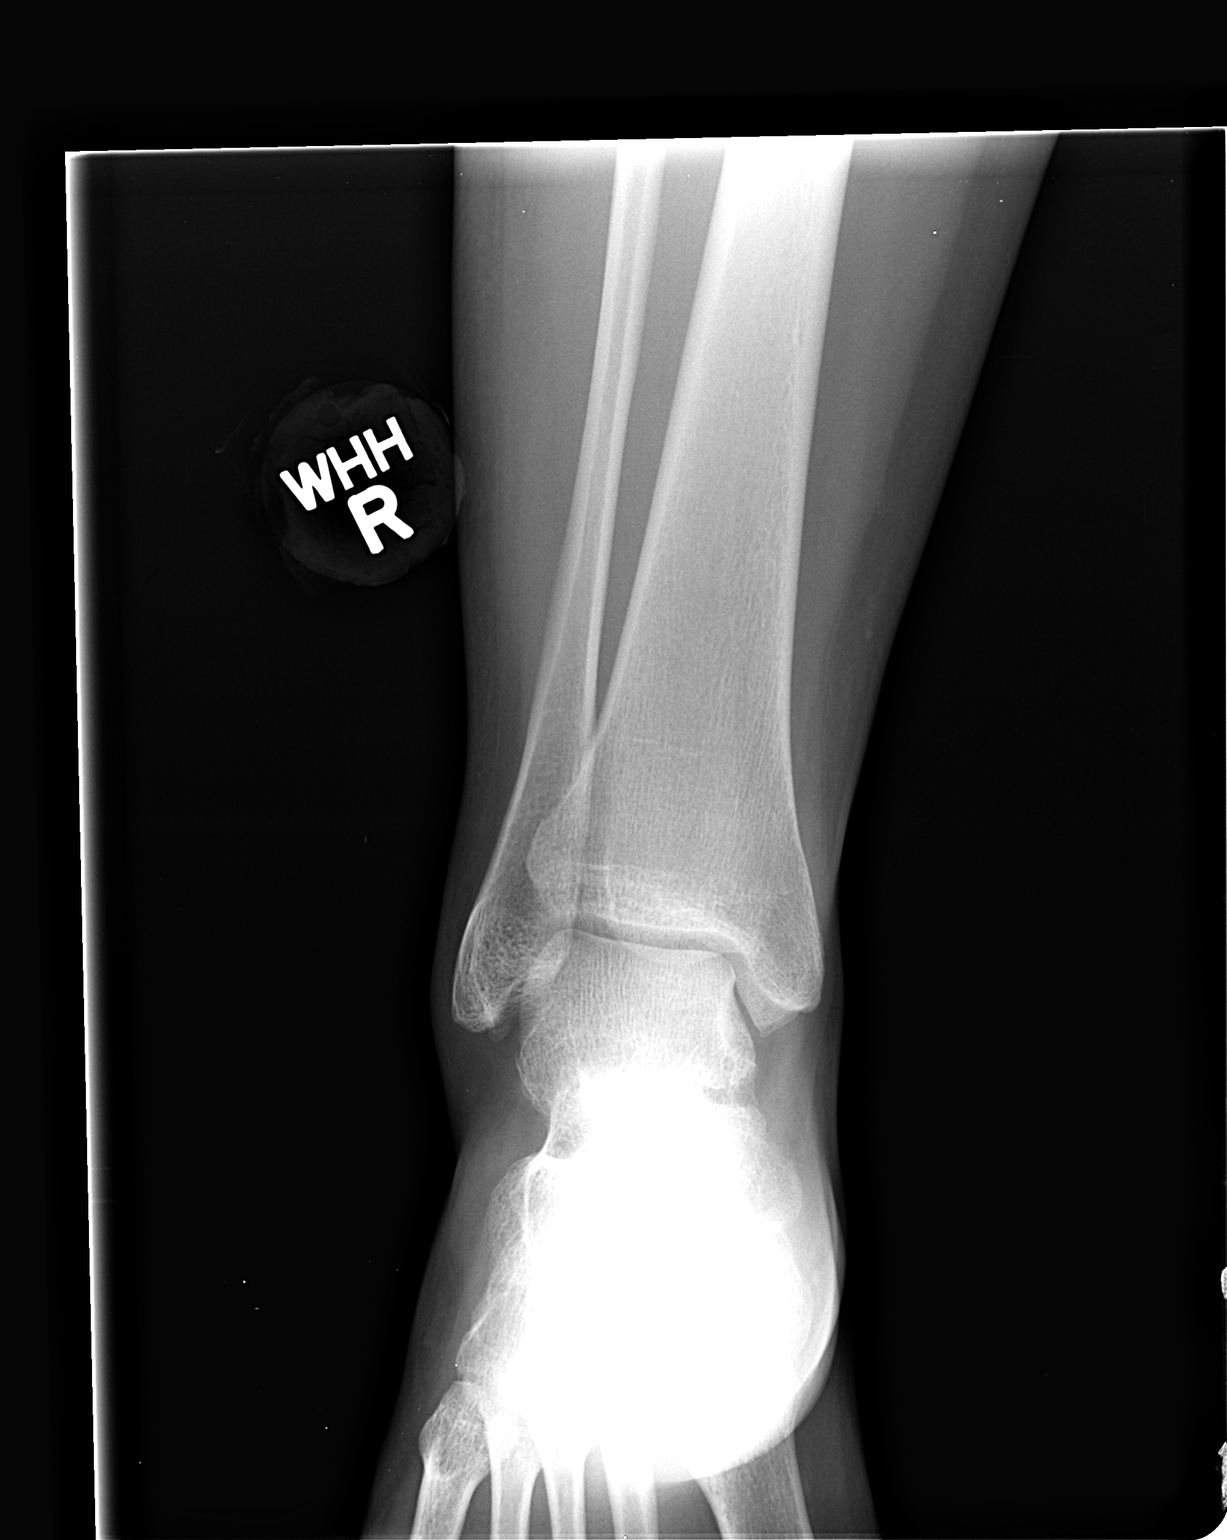

[view not recorded (2 of 3)]
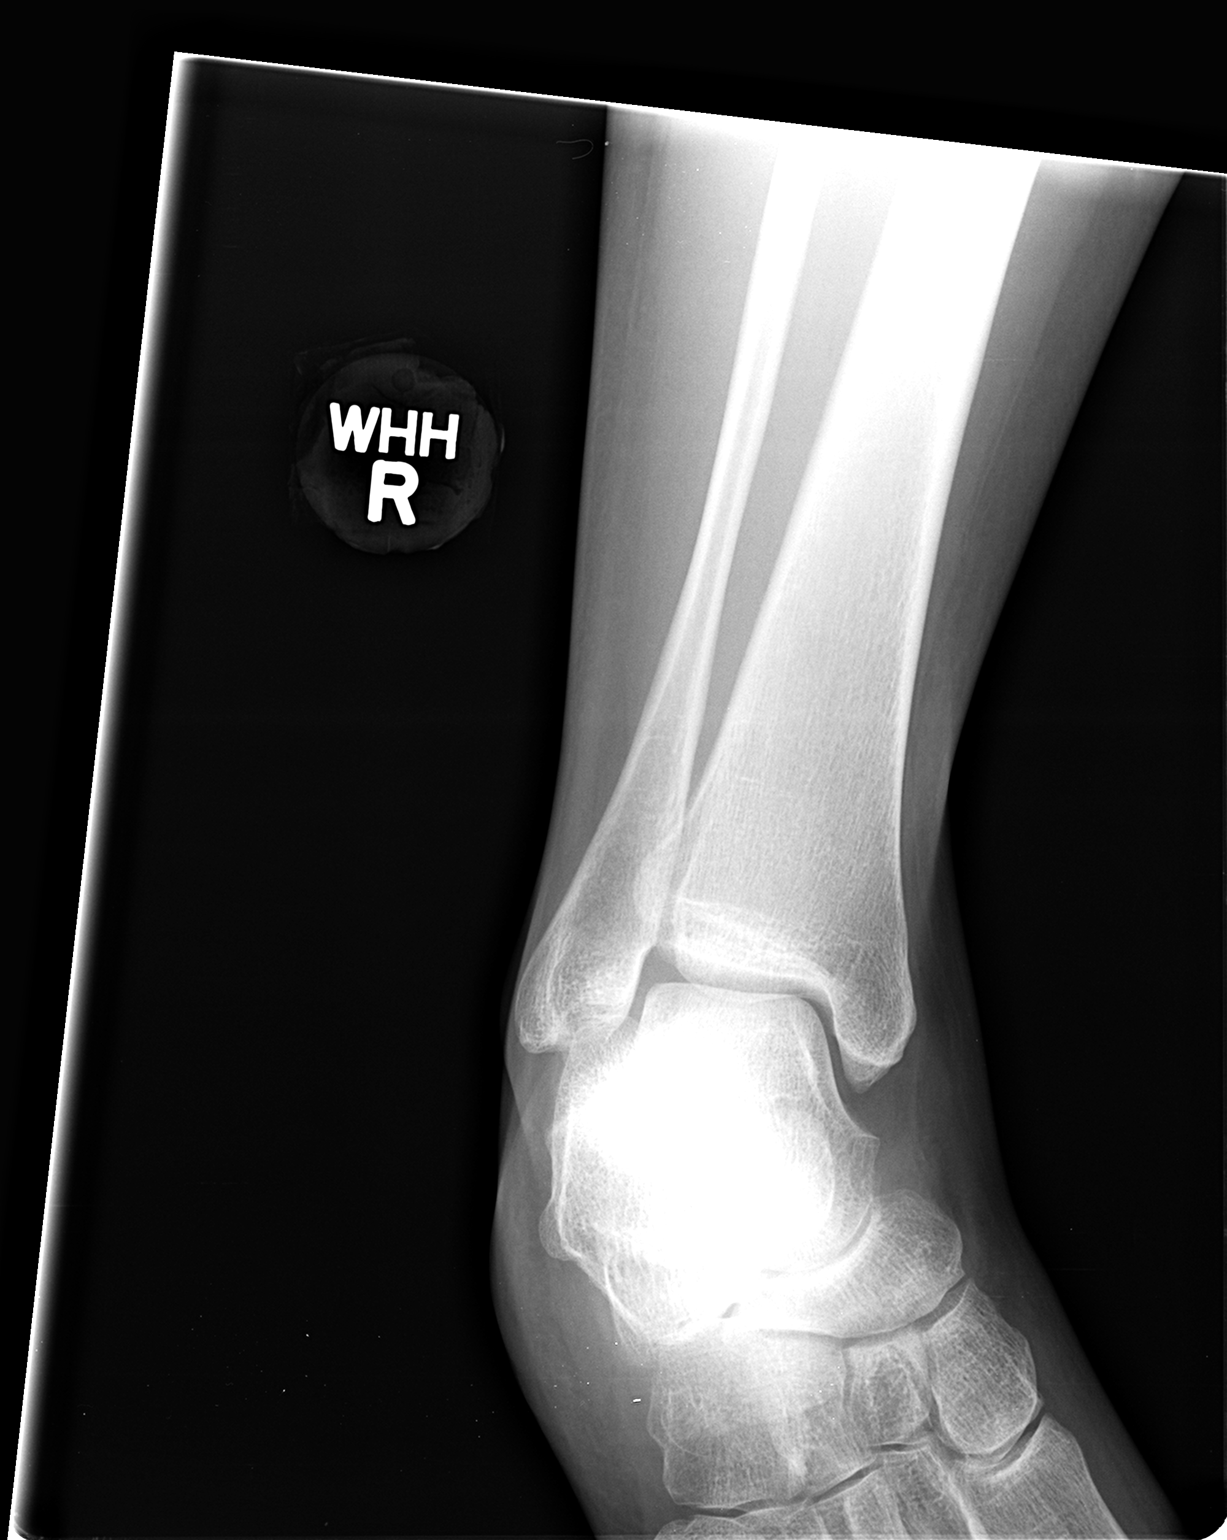

[view not recorded (3 of 3)]
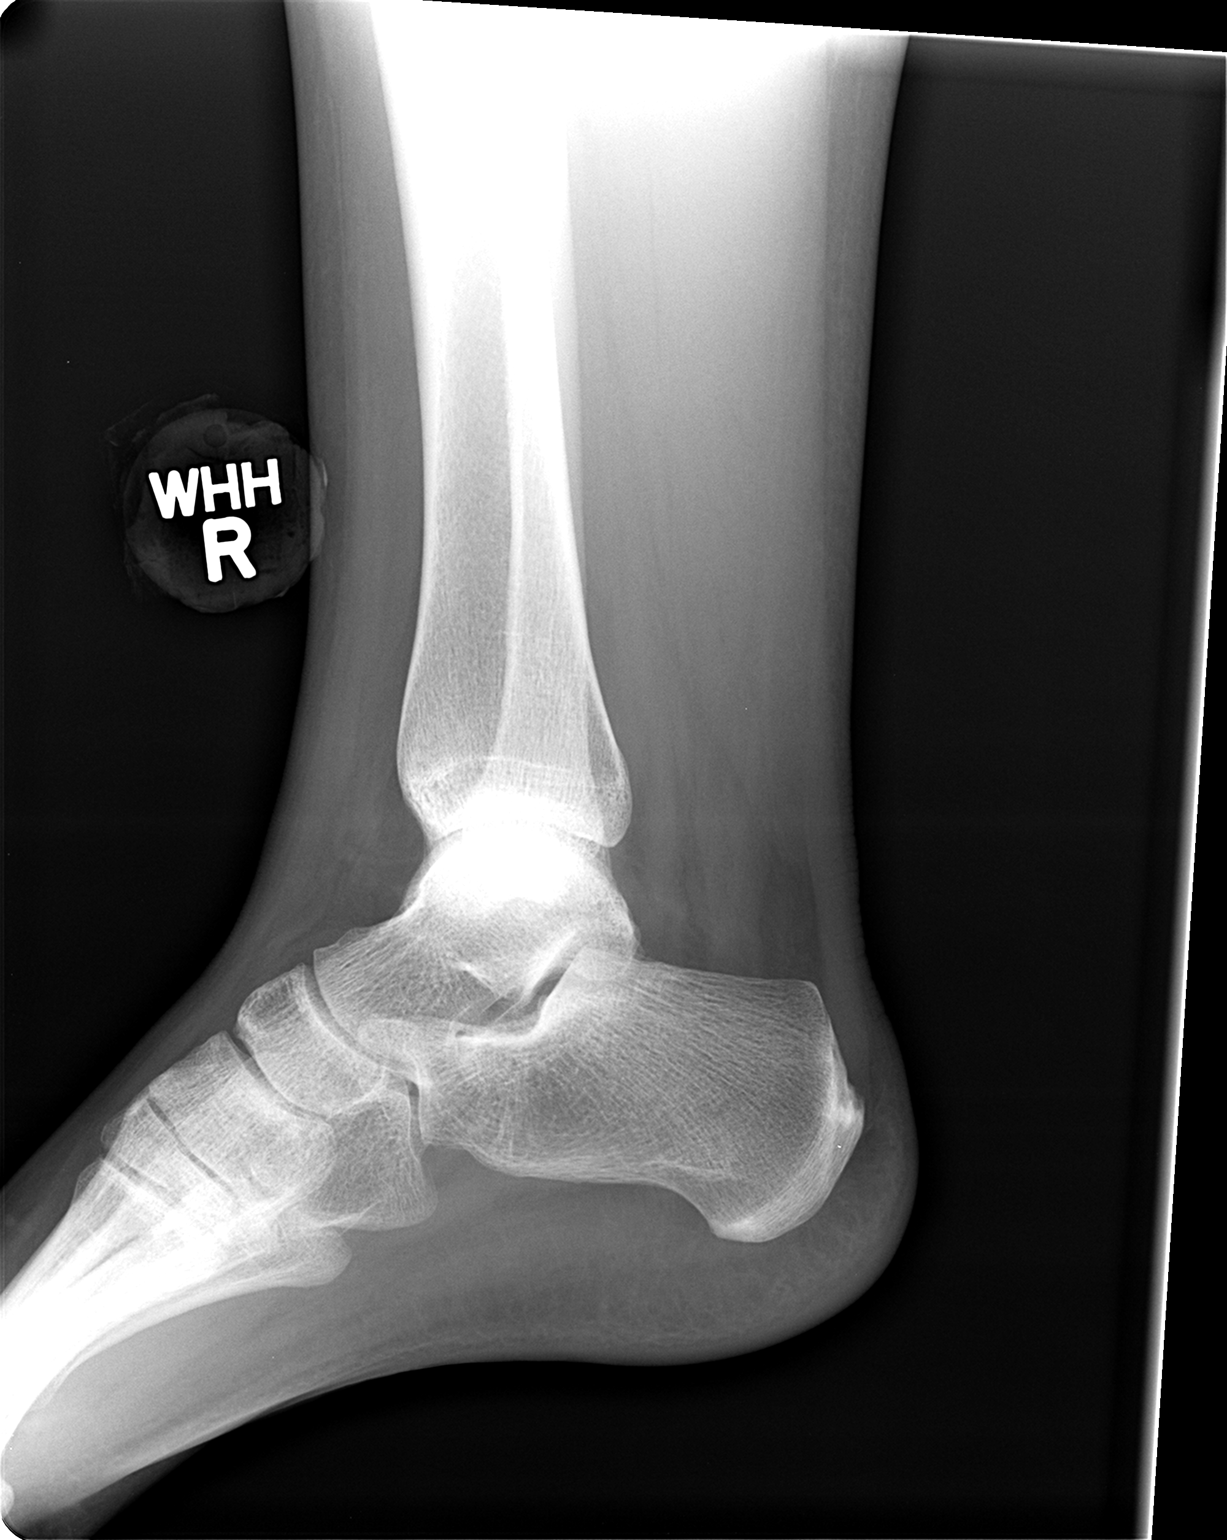

[3 of 3 positions shown; findings below may reference images not displayed]

FINDINGS: There is no evidence for acute fracture or dislocation.
No soft tissue foreign body or gas identified.
IMPRESSION: Negative exam.
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
## 2013-10-03 ENCOUNTER — Other Ambulatory Visit: Payer: Self-pay | Admitting: Obstetrics and Gynecology

## 2013-10-03 DIAGNOSIS — O3680X0 Pregnancy with inconclusive fetal viability, not applicable or unspecified: Secondary | ICD-10-CM

## 2013-10-09 ENCOUNTER — Other Ambulatory Visit: Payer: Self-pay

## 2013-10-12 ENCOUNTER — Ambulatory Visit (INDEPENDENT_AMBULATORY_CARE_PROVIDER_SITE_OTHER): Payer: Medicaid Other

## 2013-10-12 DIAGNOSIS — O3680X0 Pregnancy with inconclusive fetal viability, not applicable or unspecified: Secondary | ICD-10-CM

## 2013-10-12 NOTE — Progress Notes (Signed)
U/S(10+2wks)-single IUP with +FCA noted, FHR-158 bpm, cx appears closed, bilateral adnexa appears WNL, CRL c/w LMP dates,

## 2013-10-22 ENCOUNTER — Encounter: Payer: Self-pay | Admitting: Women's Health

## 2013-10-31 ENCOUNTER — Encounter: Payer: Self-pay | Admitting: Women's Health

## 2013-11-12 ENCOUNTER — Encounter: Payer: Self-pay | Admitting: Adult Health

## 2013-11-19 ENCOUNTER — Encounter: Payer: Self-pay | Admitting: Women's Health

## 2013-11-21 ENCOUNTER — Encounter: Payer: Self-pay | Admitting: Women's Health

## 2013-11-30 ENCOUNTER — Encounter: Payer: Self-pay | Admitting: *Deleted

## 2013-11-30 ENCOUNTER — Encounter: Payer: Self-pay | Admitting: Adult Health

## 2013-12-25 ENCOUNTER — Encounter: Payer: Medicaid Other | Admitting: Women's Health

## 2014-08-17 ENCOUNTER — Encounter (HOSPITAL_COMMUNITY): Payer: Self-pay | Admitting: *Deleted

## 2015-03-12 ENCOUNTER — Encounter (HOSPITAL_COMMUNITY): Payer: Self-pay

## 2015-03-12 ENCOUNTER — Emergency Department (HOSPITAL_COMMUNITY)
Admission: EM | Admit: 2015-03-12 | Discharge: 2015-03-12 | Disposition: A | Discharge status: Home / Self Care | Payer: Medicaid Other | Attending: Emergency Medicine | Admitting: Emergency Medicine

## 2015-03-12 DIAGNOSIS — F17210 Nicotine dependence, cigarettes, uncomplicated: Secondary | ICD-10-CM | POA: Insufficient documentation

## 2015-03-12 DIAGNOSIS — Z792 Long term (current) use of antibiotics: Secondary | ICD-10-CM | POA: Insufficient documentation

## 2015-03-12 DIAGNOSIS — L728 Other follicular cysts of the skin and subcutaneous tissue: Secondary | ICD-10-CM

## 2015-03-12 DIAGNOSIS — L729 Follicular cyst of the skin and subcutaneous tissue, unspecified: Secondary | ICD-10-CM | POA: Insufficient documentation

## 2015-03-12 DIAGNOSIS — Z8541 Personal history of malignant neoplasm of cervix uteri: Secondary | ICD-10-CM | POA: Insufficient documentation

## 2015-03-12 MED ORDER — DOXYCYCLINE HYCLATE 100 MG PO CAPS: 100.0000 mg | ORAL_CAPSULE | Freq: Two times a day (BID) | ORAL | Status: AC

## 2015-03-12 MED ORDER — DOXYCYCLINE HYCLATE 100 MG PO TABS
100.0000 mg | ORAL_TABLET | Freq: Once | ORAL | Status: AC
  Administered 2015-03-12: 100 mg via ORAL
  Filled 2015-03-12: qty 1

## 2015-03-12 MED ORDER — TRAMADOL HCL 50 MG PO TABS
50.0000 mg | ORAL_TABLET | Freq: Once | ORAL | Status: AC
  Administered 2015-03-12: 50 mg via ORAL
  Filled 2015-03-12: qty 1

## 2015-03-12 MED ORDER — TRAMADOL HCL 50 MG PO TABS: 50.0000 mg | ORAL_TABLET | Freq: Four times a day (QID) | ORAL | Status: AC | PRN

## 2015-03-12 MED ORDER — IBUPROFEN 800 MG PO TABS
800.0000 mg | ORAL_TABLET | Freq: Once | ORAL | Status: AC
  Administered 2015-03-12: 800 mg via ORAL
  Filled 2015-03-12: qty 1

## 2015-03-12 MED ORDER — PROMETHAZINE HCL 12.5 MG PO TABS
12.5000 mg | ORAL_TABLET | Freq: Once | ORAL | Status: AC
  Administered 2015-03-12: 12.5 mg via ORAL
  Filled 2015-03-12: qty 1

## 2015-03-12 MED ORDER — IBUPROFEN 600 MG PO TABS: 600.0000 mg | ORAL_TABLET | Freq: Four times a day (QID) | ORAL | Status: AC | PRN

## 2015-03-12 NOTE — ED Provider Notes (Signed)
CSN: [id number]     Arrival date & time 03/12/15  1118 History   First MD Initiated Contact with Patient 03/12/15 1139     Chief Complaint  Patient presents with  . Back Pain     (Consider location/radiation/quality/duration/timing/severity/associated sxs/prior Treatment) HPI Comments: Patient presents to the emergency department with mid back area pain. She denies any recent injury or trauma to the area. She states she's been lifting her son who weighs about 25 pounds, but no other problems or issues with lifting, pushing, pulling. She's not had any drainage from the area. His been no fever or chills reported. The patient states that it is of note that her mother, grandmother, and another family member all have a cyst, and they started with pain similar to this.  Patient is a 24 y.o. female presenting with back pain. The history is provided by the patient.  Back Pain   Past Medical History  Diagnosis Date  . Pap smear abnormality of cervix   . Cancer of cervix [facility])    Past Surgical History  Procedure Laterality Date  . Gynecologic cryosurgery     Family History  Problem Relation Age of Onset  . Heart disease    . Arthritis    . Lung disease    . Cancer    . Asthma    . Diabetes     Social History  Substance Use Topics  . Smoking status: Current Every Day Smoker -- 1.00 packs/day for 8 years    Types: Cigarettes  . Smokeless tobacco: Never Used  . Alcohol Use: None     Comment: denies   OB History    Gravida Para Term Preterm AB TAB SAB Ectopic Multiple Living   3 2 0 2 0 0 0 0 0 2      Review of Systems  Musculoskeletal: Positive for back pain.  All other systems reviewed and are negative.     Allergies  Review of patient's allergies indicates no known allergies.  Home Medications   Prior to Admission medications   Medication Sig Start Date End Date Taking? Authorizing Provider  amoxicillin (AMOXIL) 500 MG capsule 2 po bid with food 01/04/12   Lily Kocher, PA-C  HYDROcodone-acetaminophen [facility]) 5-325 MG per tablet 1 or 2 po q4h prn pain 01/04/12   Lily Kocher, PA-C  meloxicam (MOBIC) 7.5 MG tablet 1 po bid with food 01/04/12   Lily Kocher, PA-C  sulfamethoxazole-trimethoprim (SEPTRA DS) 800-160 MG per tablet Take 1 tablet by mouth every 12 (twelve) hours. 01/04/12   Lily Kocher, PA-C   BP 112/59 mmHg  Pulse 92  Temp(Src) 97.5 F (36.4 C) (Oral)  Resp 18  Ht 5\' 3"  (1.6 m)  Wt 52.164 kg  BMI 20.38 kg/m2  SpO2 100%  LMP 03/08/2015  Breastfeeding? No Physical Exam  Constitutional: She is oriented to person, place, and time. She appears well-developed and well-nourished.  Non-toxic appearance.  HENT:  Head: Normocephalic.  Right Ear: Tympanic membrane and external ear normal.  Left Ear: Tympanic membrane and external ear normal.  Eyes: EOM and lids are normal. Pupils are equal, round, and reactive to light.  Neck: Normal range of motion. Neck supple. Carotid bruit is not present.  Cardiovascular: Normal rate, regular rhythm, normal heart sounds, intact distal pulses and normal pulses.   Pulmonary/Chest: Breath sounds normal. No respiratory distress.  Abdominal: Soft. Bowel sounds are normal. There is no tenderness. There is no guarding.  Musculoskeletal: Normal range of motion.  Small  cyst of the mid back. No red streaking. No drainage. Tender to palpation.  Lymphadenopathy:       Head (right side): No submandibular adenopathy present.       Head (left side): No submandibular adenopathy present.    She has no cervical adenopathy.  Neurological: She is alert and oriented to person, place, and time. She has normal strength. No cranial nerve deficit or sensory deficit. She exhibits normal muscle tone. Coordination normal.  Skin: Skin is warm and dry.  Psychiatric: She has a normal mood and affect. Her speech is normal.  Nursing note and vitals reviewed.   ED Course  Procedures (including critical care time) Labs  Review Labs Reviewed - No data to display  Imaging Review No results found. I have personally reviewed and evaluated these images and lab results as part of my medical decision-making.   EKG Interpretation None      MDM  Vital signs are well within normal limits. Patient has a cyst of the mid back area. The patient is advised to use doxycycline and ibuprofen daily she is given a prescription for Ultram to assist with her pain. She is to see Dr. Arnoldo Morale for surgical evaluation if not improved by the antibiotic and the warm tub soaks.    Final diagnoses:  None    *I have reviewed nursing notes, vital signs, and all appropriate lab and imaging results for this patient.Lily Kocher, PA-C 03/12/15 [place], MD 03/13/15 [phone]

## 2015-03-12 NOTE — Discharge Instructions (Signed)
Please use warm saltwater soaks 2 times daily. If not resolved in the next 7 days, please see Dr. Arnoldo Morale for surgical evaluation. Please use doxycycline 2 times daily, and ibuprofen 3 times daily. Use Ultram every 6 hours if needed for pain. Altered may cause drowsiness, please use this medicine with caution. Please do not take these medications on any of the stomach.

## 2015-03-12 NOTE — ED Notes (Signed)
Pt reports woke up 3 days ago and pain and a knot in center of back.  Denies injury.

## 2015-03-14 NOTE — ED Notes (Signed)
Pt called stating that her Rx for doxycycline was too expensive and needed to have it changed to a 4$ list medication. Lily Kocher, PA-C, consulted and changed her medication to Septra DS. Patient notified and Rx called into [facility] in [place].
